# Patient Record
Sex: Male | Born: 1965 | Race: Black or African American | Smoking: Never smoker
Health system: Southern US, Community
[De-identification: ages and names within clinical notes are randomized; demographics above are authoritative.]

## PROBLEM LIST (undated history)

## (undated) DIAGNOSIS — I10 Essential (primary) hypertension: Secondary | ICD-10-CM

## (undated) HISTORY — PX: NO PAST SURGERIES: SHX2092

---

## 2007-09-08 ENCOUNTER — Ambulatory Visit: Payer: Self-pay | Admitting: Internal Medicine

## 2010-05-23 ENCOUNTER — Ambulatory Visit: Payer: Self-pay | Admitting: Internal Medicine

## 2011-03-27 ENCOUNTER — Ambulatory Visit: Payer: Self-pay

## 2011-08-28 ENCOUNTER — Ambulatory Visit: Payer: Self-pay

## 2012-02-21 IMAGING — CT CT CHEST W/ CM
1 of 2 series · 15 of 32 positions shown, 19 images · IV contrast (APPLIED)
Comparison: None

REASON FOR EXAM: ADD ON CR [id number] or [id number]   chest pain  abn EKG
eval PE
COMMENTS:

PROCEDURE:     KCT - KCT CHEST WITH CONTRAST  - May 23, 2010 [DATE]
RESULT:     Indications: Chest pain
TECHNIQUE: A thin-section spiral CT from the lung apices to the upper
abdomen was acquired on a multi slice scanner following 100ml [id number]
intravenous contrast. These images were then transferred to the Siemens work
station and were subsequently reviewed utilizing 3-D reconstructions and MIP
images.

[Series 4: pe_avg 3.0 i41f · axial · 0.64mm/px · z∈[-134,+106]mm · 15 of 94 slices shown, 19 images]
[im 7/94  mediastinal]
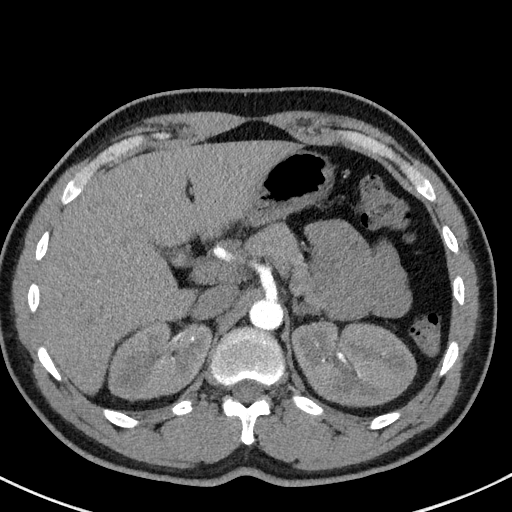
[im 7/94  lung]
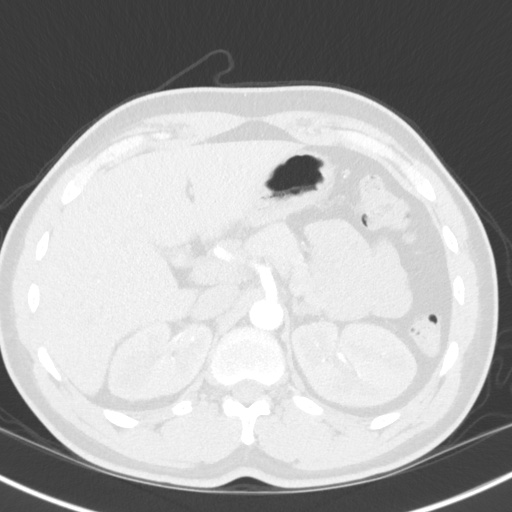
[im 13/94  lung]
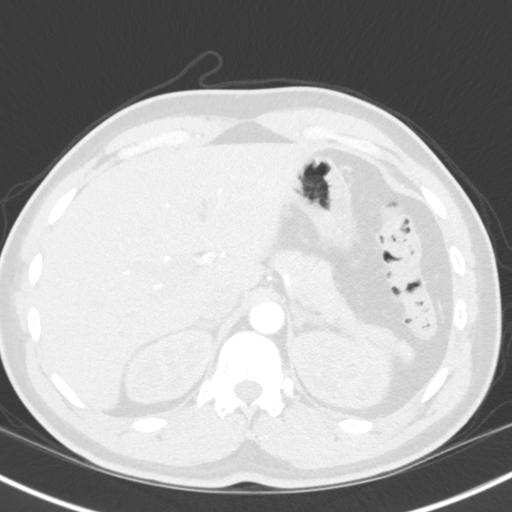
[im 19/94  lung]
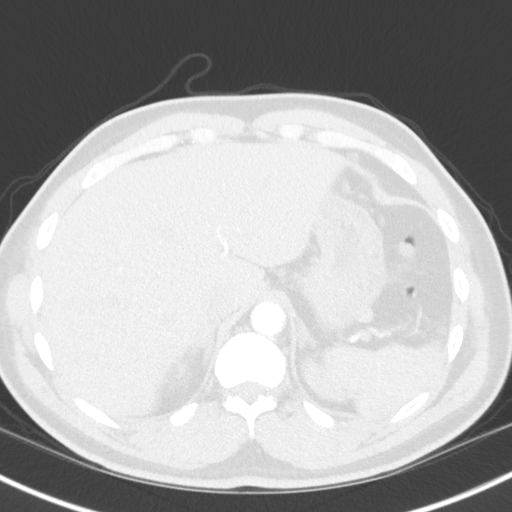
[im 25/94  lung]
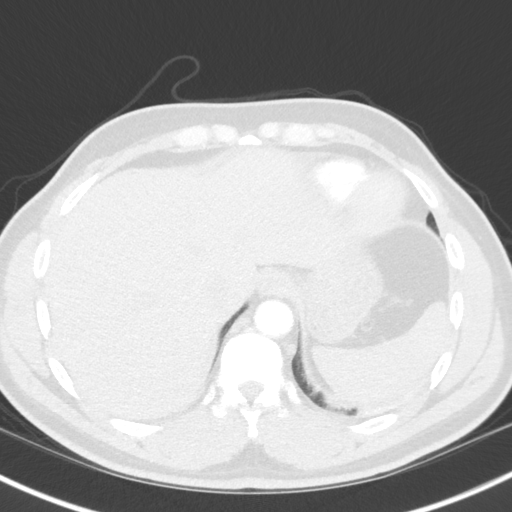
[im 32/94  mediastinal]
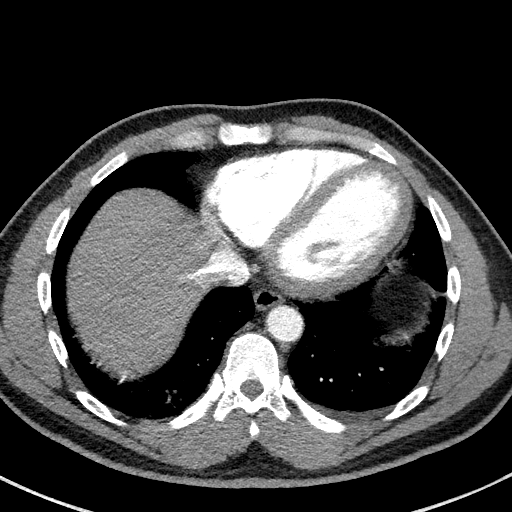
[im 32/94  lung]
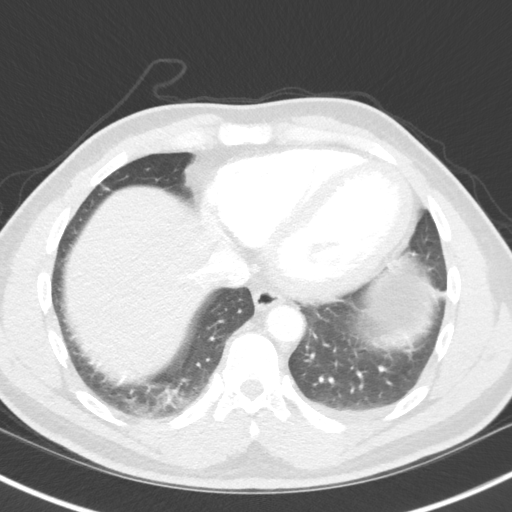
[im 38/94  lung]
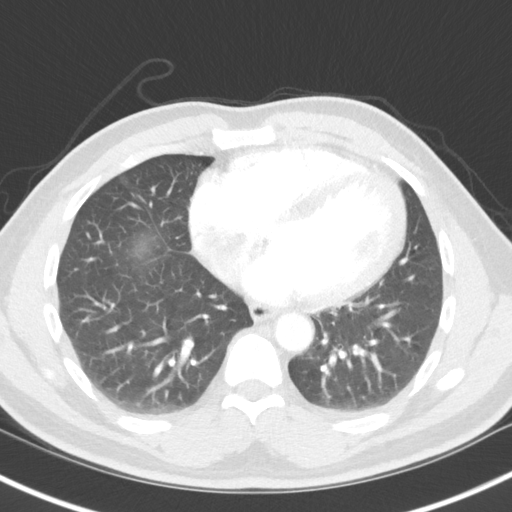
[im 44/94  lung]
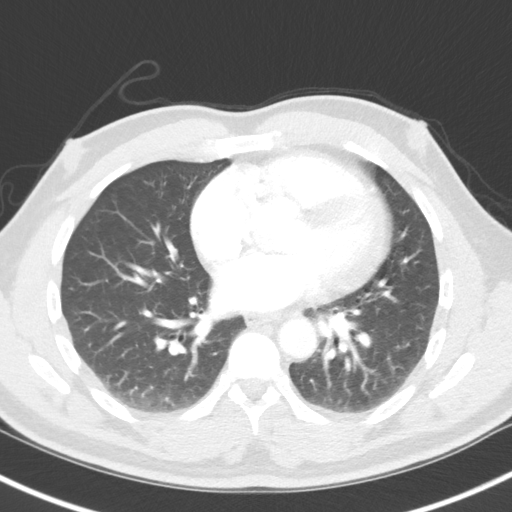
[im 47/94  lung]
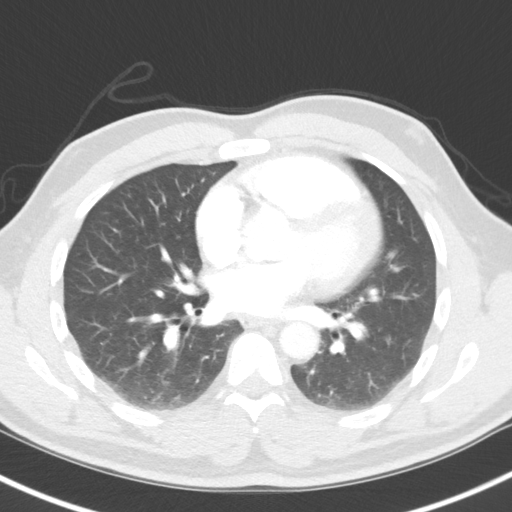
[im 50/94  mediastinal]
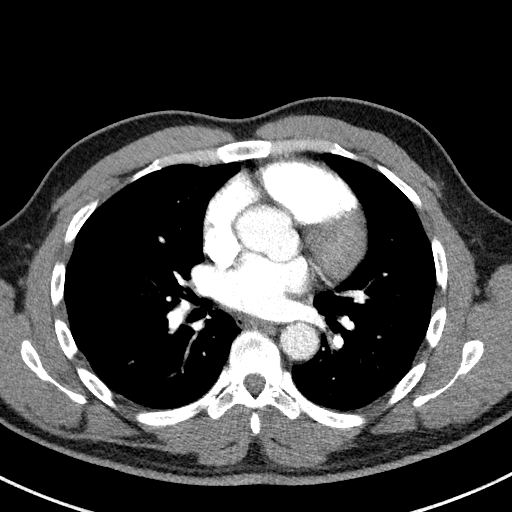
[im 50/94  lung]
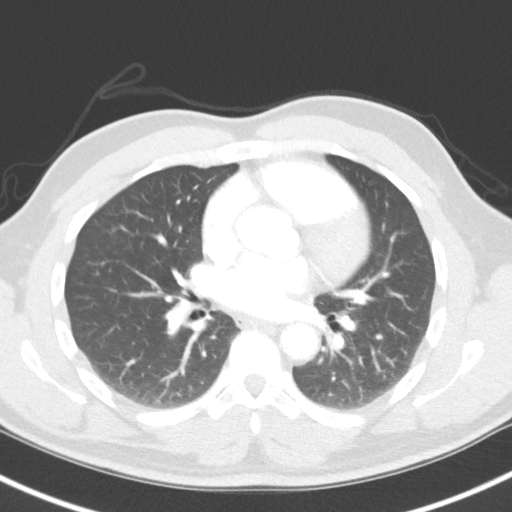
[im 56/94  lung]
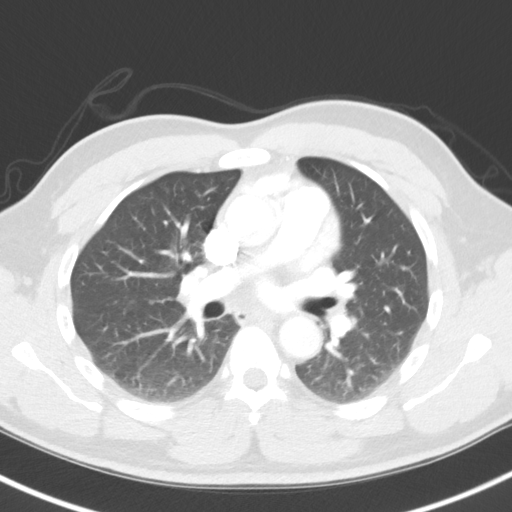
[im 63/94  lung]
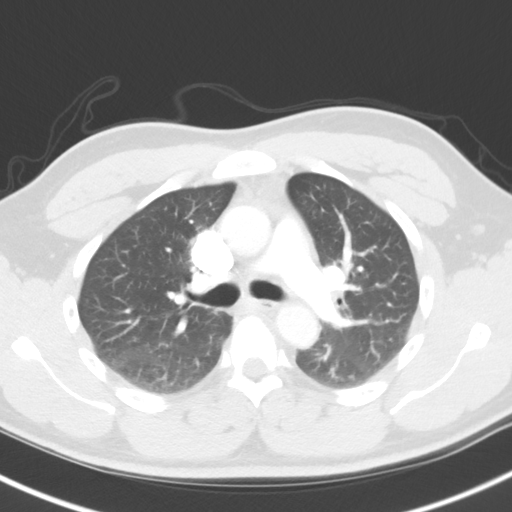
[im 69/94  lung]
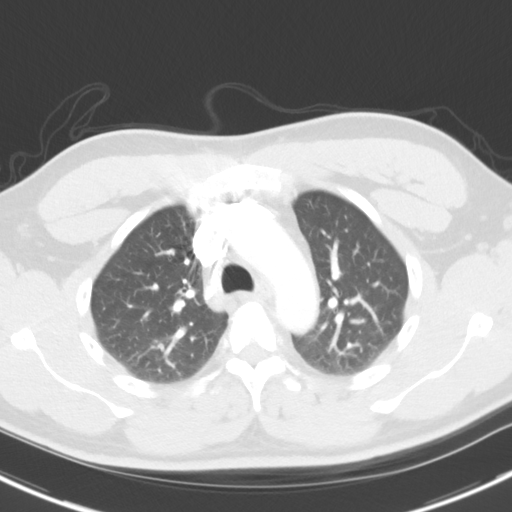
[im 75/94  mediastinal]
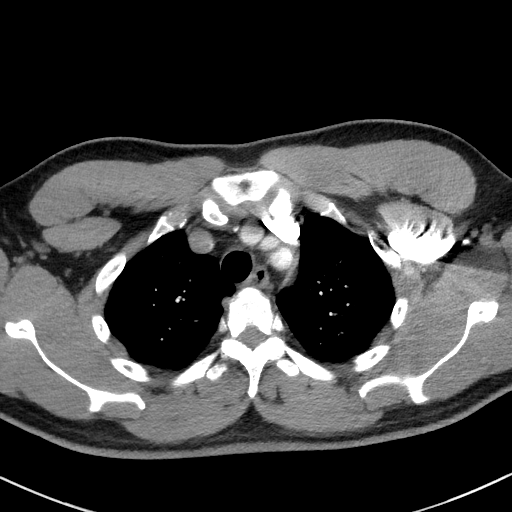
[im 75/94  lung]
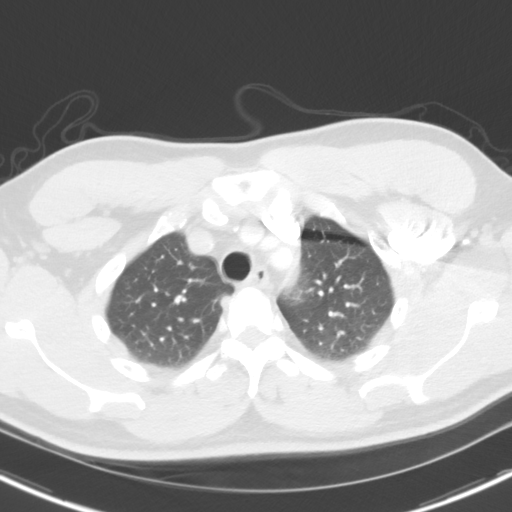
[im 81/94  lung]
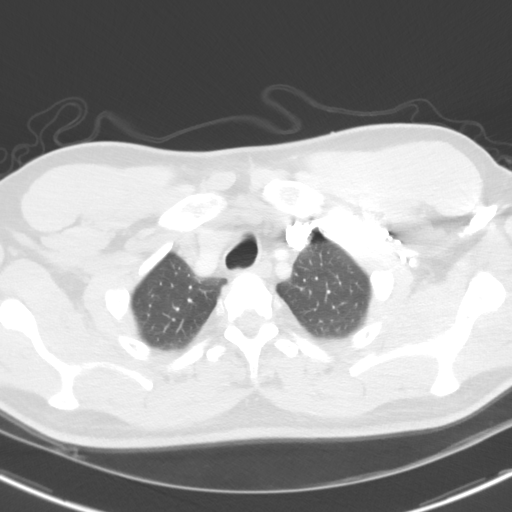
[im 87/94  lung]
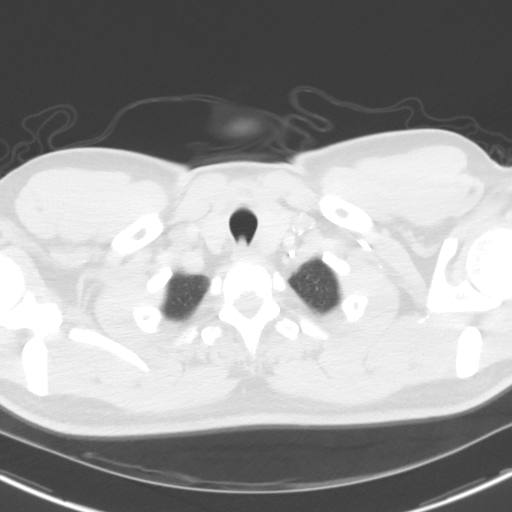

[15 of 32 positions shown; findings below may reference images not displayed]

FINDINGS: There is adequate opacification of the pulmonary arteries. There is no
pulmonary embolus. The main pulmonary artery, right main pulmonary artery,
and left main pulmonary arteries are normal in size. The heart size is
normal. There is no pericardial effusion.

The lungs are clear. There is no focal consolidation, pleural effusion, or
pneumothorax.

There is no axillary, hilar, or mediastinal adenopathy.

The osseous structures are unremarkable.

The visualized portions of the upper abdomen are unremarkable.
IMPRESSION: 1. No CT evidence of pulmonary embolus.

## 2013-05-28 IMAGING — CR CERVICAL SPINE - COMPLETE 4+ VIEW
1 series · 7 of 7 positions shown · non-contrast
Comparison: none

REASON FOR EXAM: pain
COMMENTS:

[Series 1: lat · 0.17mm/px · 7 of 7 slices shown]
[im 1/7]
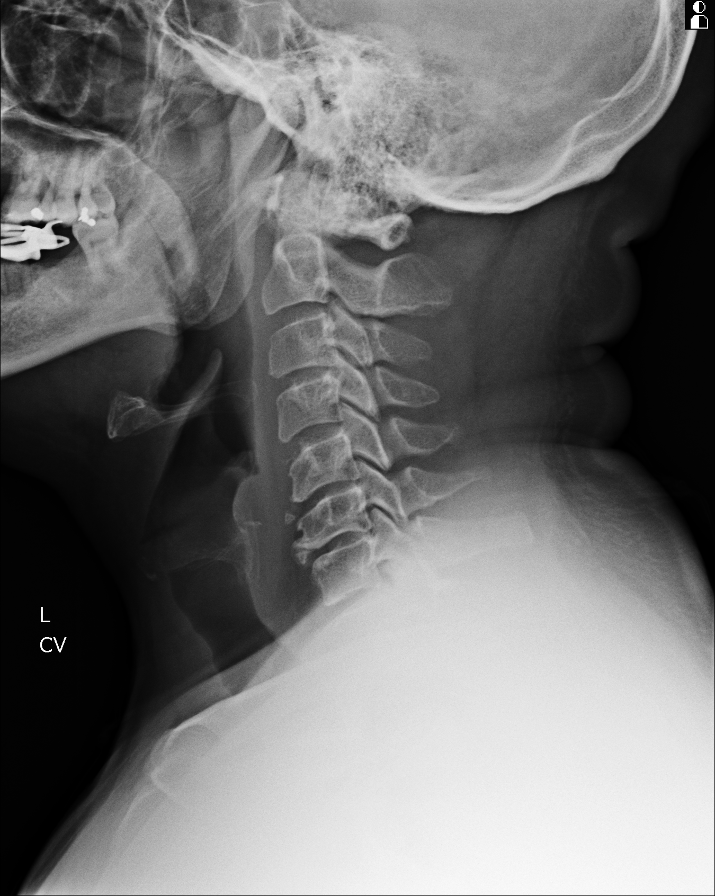
[im 2/7]
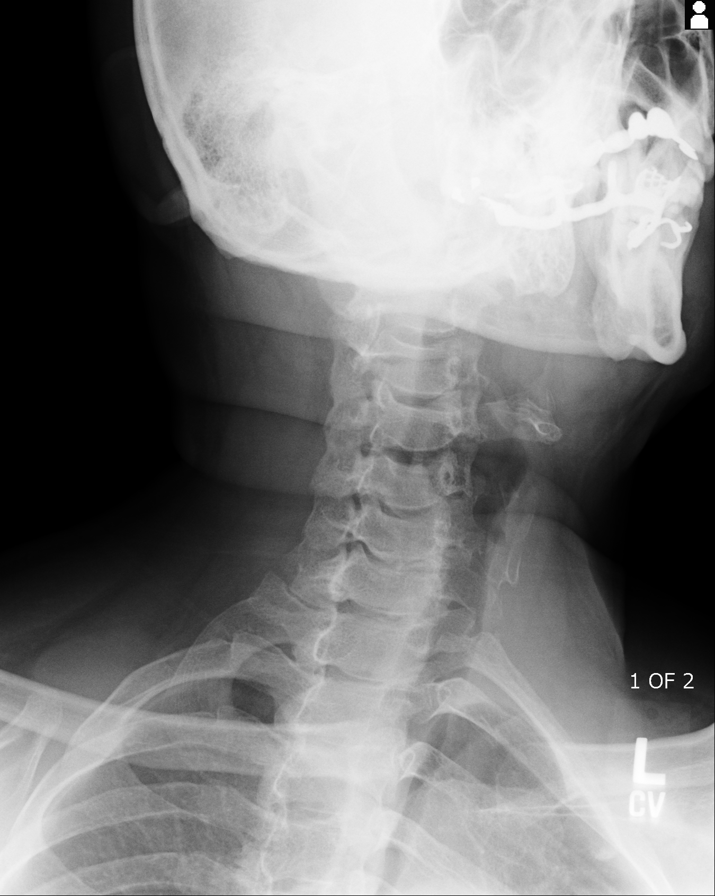
[im 3/7]
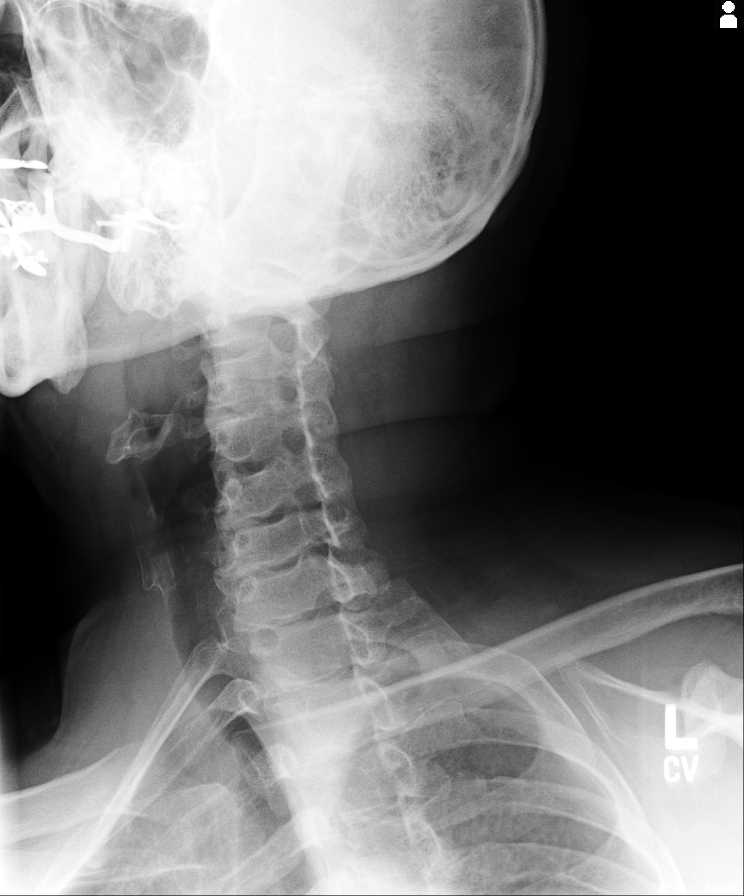
[im 4/7]
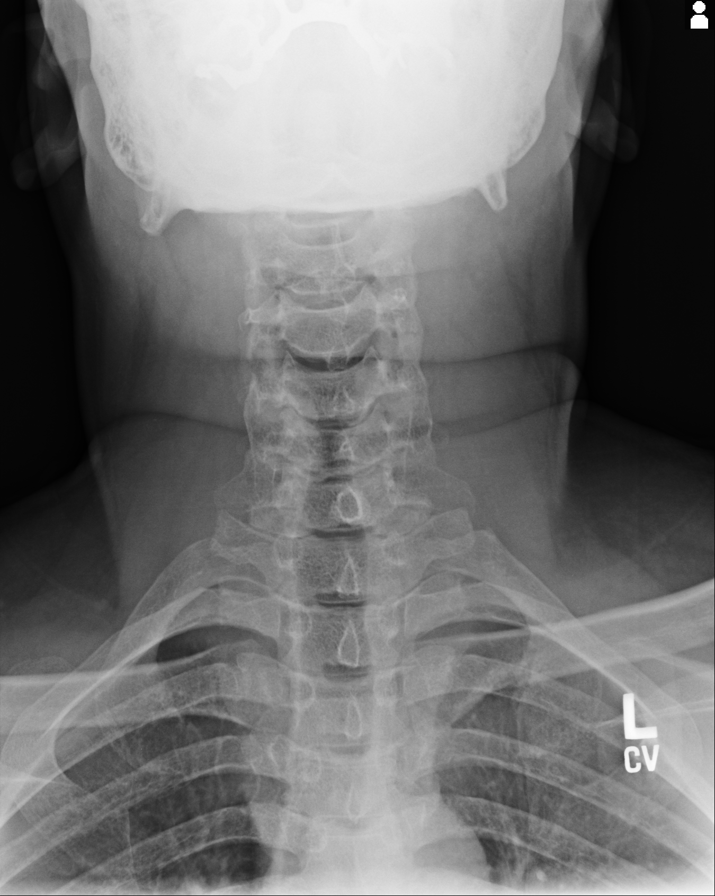
[im 5/7]
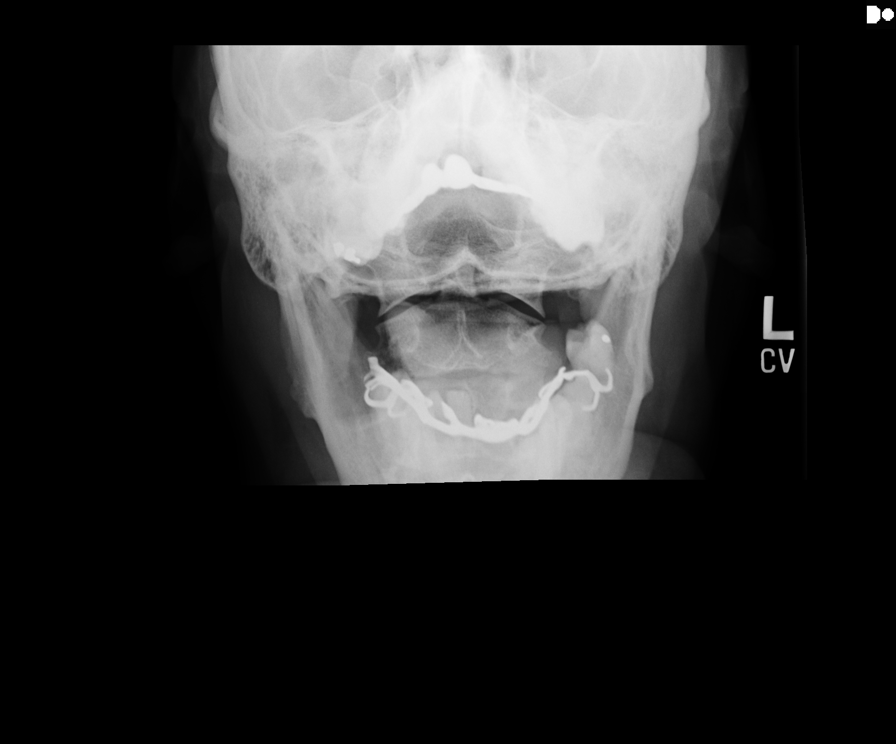
[im 6/7]
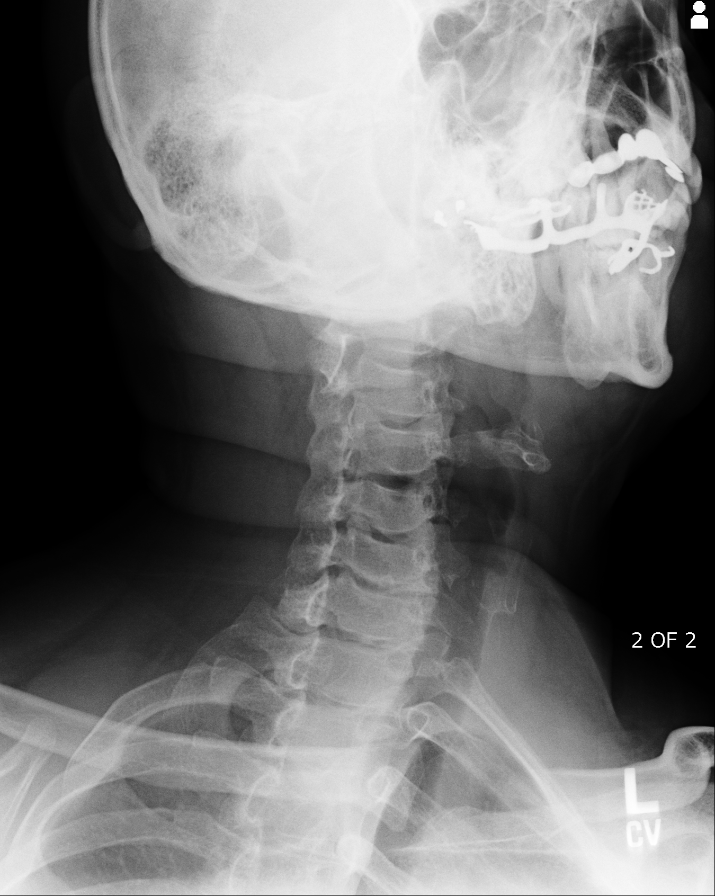
[im 7/7]
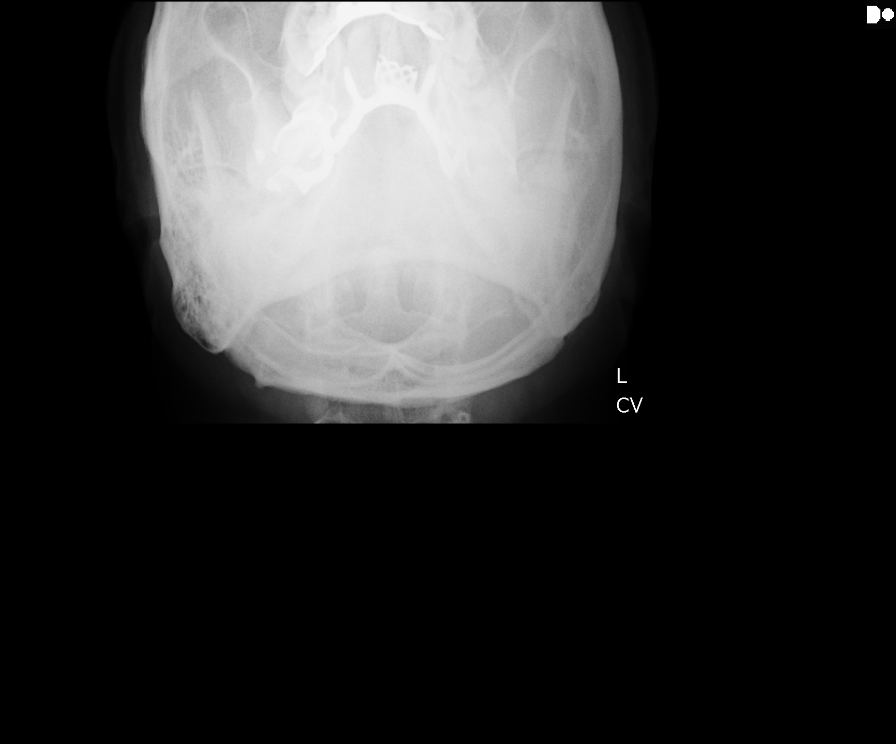

[7 of 7 positions shown; findings below may reference images not displayed]

PROCEDURE:     KDR - KDXR C-SPINE COMPLETE  - August 28, 2011  [DATE]

RESULT:     The vertebral body heights are well maintained. Vertebral body
alignment is normal. Oblique views show no significant abnormalities of the
articular facets. There is observed slight narrowing of the cervical and
vertebral disc spaces at C5-6 and C6-7. The odontoid process is intact.
Cervical rib formation is seen.
IMPRESSION: 1.  No fracture is identified.
2.  Vertebral body alignment is normal.
3.  There is slight narrowing of the C5-6 and C6-7 cervical intervertebral
disc spaces. The findings are consistent with cervical disc disease. This
can be further evaluated with MR, if clinically indicated.

       Dictation site; #2.

## 2013-05-28 IMAGING — CR DG SHOULDER 3+V*L*
1 series · 3 of 3 positions shown · non-contrast
Comparison: none

REASON FOR EXAM: pain
COMMENTS:

PROCEDURE:     KDR - KDXR SHOULDER LEFT COMPLETE  - August 28, 2011  [DATE]
RESULT:     No fracture, dislocation or other acute bony abnormality is
identified.

[Series 1: internal rotate · 0.17mm/px · 3 of 3 slices shown]
[im 1/3]
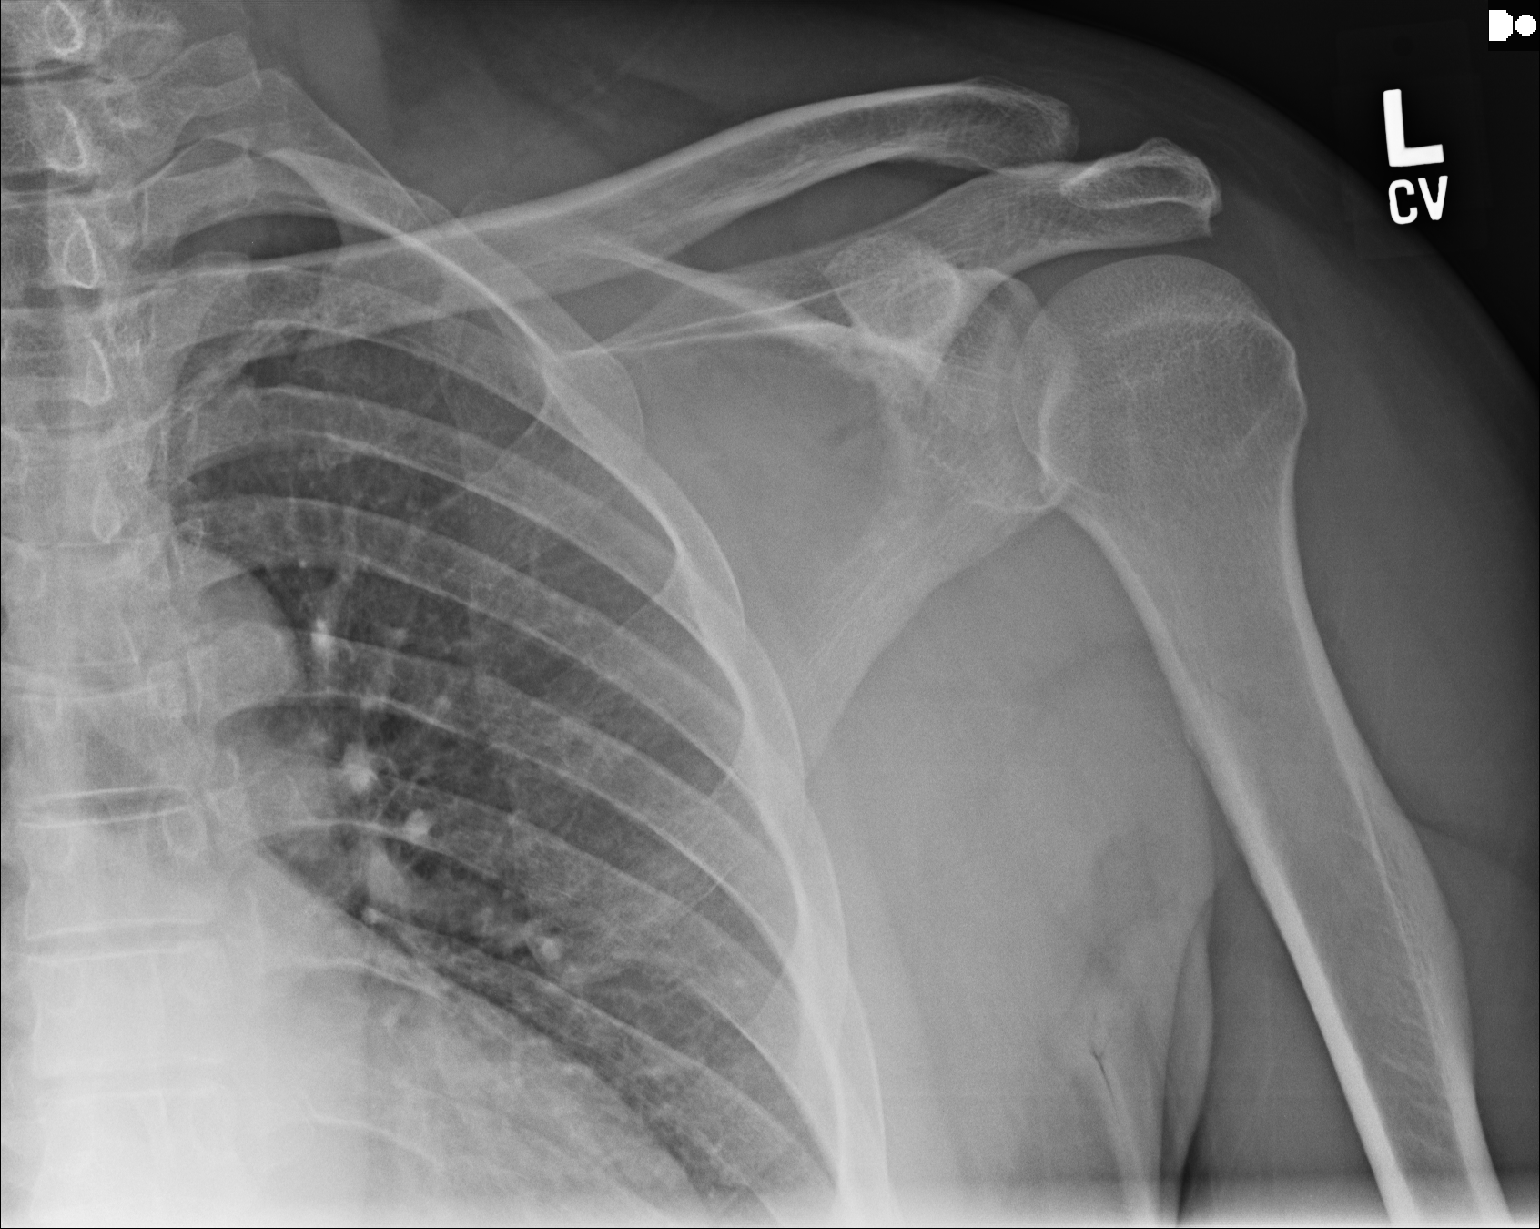
[im 2/3]
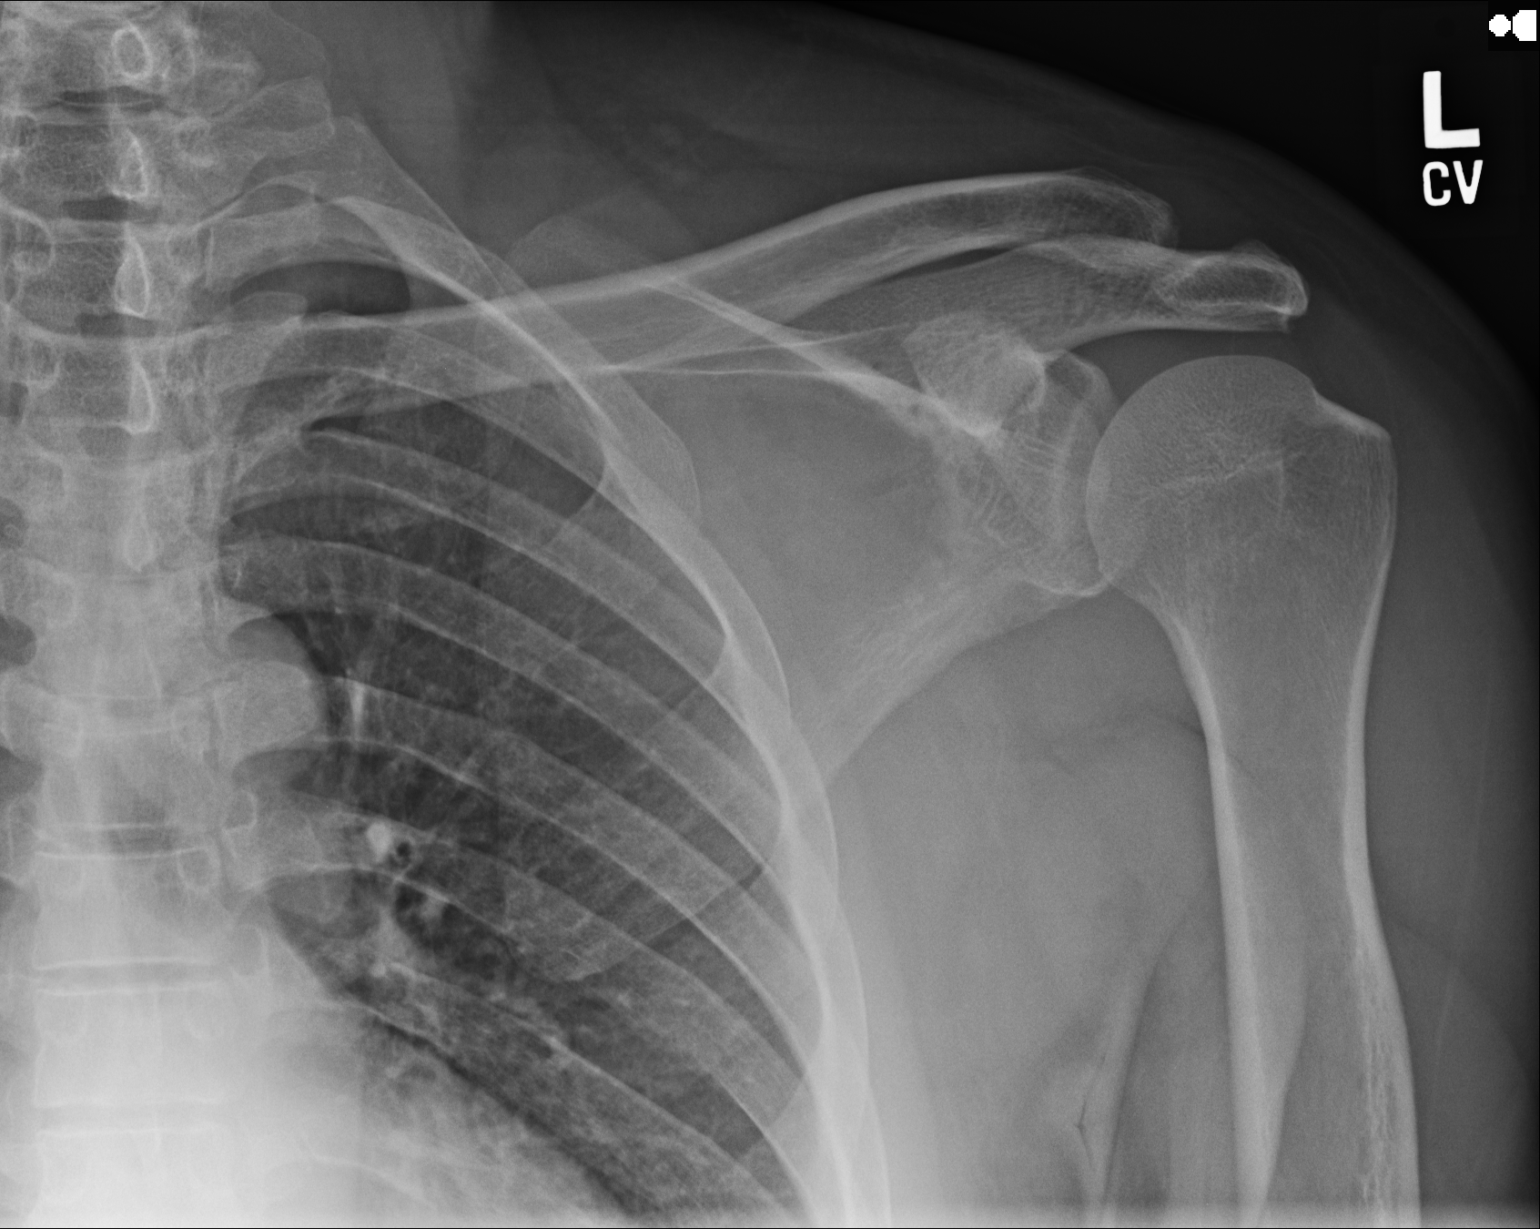
[im 3/3]
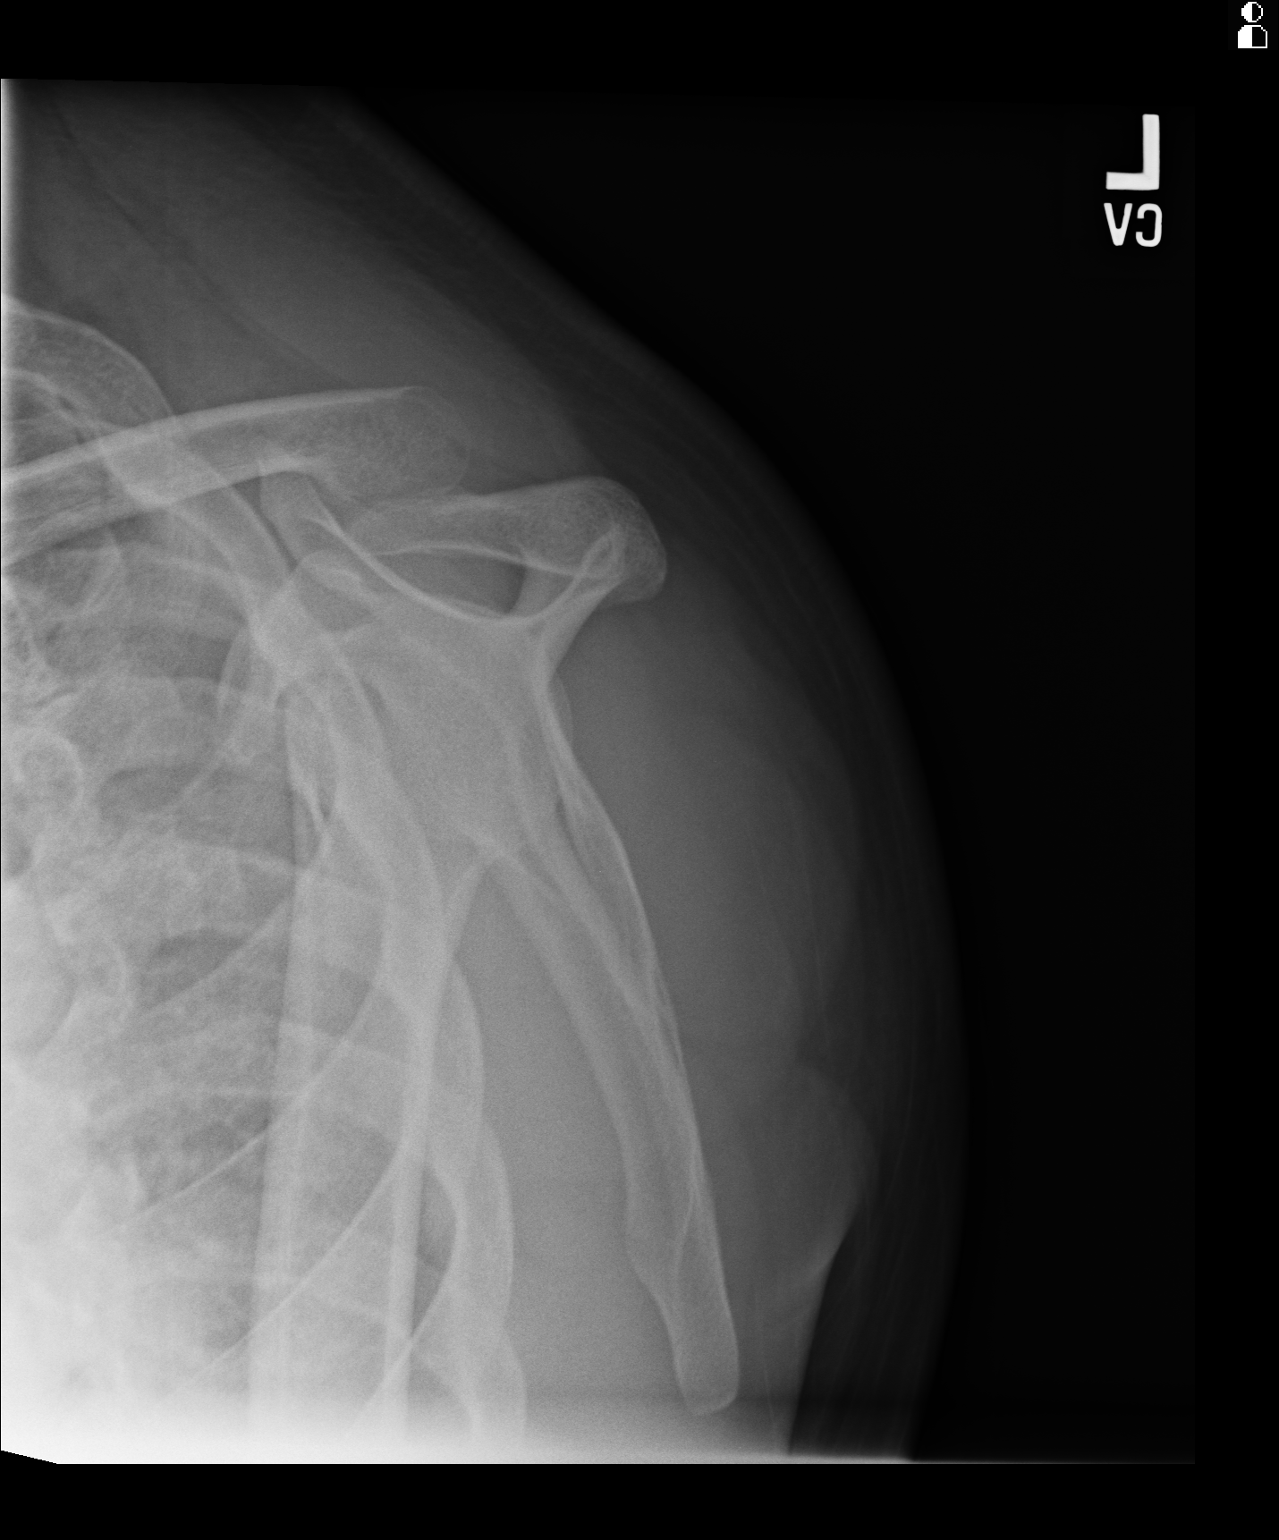

[3 of 3 positions shown; findings below may reference images not displayed]

IMPRESSION: No significant osseous abnormalities are noted.

[REDACTED]

## 2015-03-29 ENCOUNTER — Other Ambulatory Visit: Payer: Self-pay | Admitting: Gastroenterology

## 2015-06-04 ENCOUNTER — Ambulatory Visit (HOSPITAL_COMMUNITY): Admission: RE | Admit: 2015-06-04 | Payer: Self-pay | Source: Ambulatory Visit | Admitting: Gastroenterology

## 2015-06-04 ENCOUNTER — Encounter (HOSPITAL_COMMUNITY): Admission: RE | Payer: Self-pay | Source: Ambulatory Visit

## 2015-06-04 SURGERY — COLONOSCOPY WITH PROPOFOL
Anesthesia: Monitor Anesthesia Care

## 2015-06-19 ENCOUNTER — Other Ambulatory Visit: Payer: Self-pay | Admitting: Gastroenterology

## 2015-06-21 ENCOUNTER — Encounter (HOSPITAL_COMMUNITY): Payer: Self-pay | Admitting: *Deleted

## 2015-06-25 ENCOUNTER — Ambulatory Visit (HOSPITAL_COMMUNITY)
Admission: RE | Admit: 2015-06-25 | Discharge: 2015-06-25 | Disposition: A | Discharge status: Home / Self Care | Payer: BLUE CROSS/BLUE SHIELD | Source: Ambulatory Visit | Attending: Gastroenterology | Admitting: Gastroenterology

## 2015-06-25 ENCOUNTER — Encounter (HOSPITAL_COMMUNITY): Payer: Self-pay

## 2015-06-25 ENCOUNTER — Ambulatory Visit (HOSPITAL_COMMUNITY): Payer: BLUE CROSS/BLUE SHIELD | Admitting: Anesthesiology

## 2015-06-25 ENCOUNTER — Encounter (HOSPITAL_COMMUNITY)
Admission: RE | Disposition: A | Discharge status: Home / Self Care | Payer: Self-pay | Source: Ambulatory Visit | Attending: Gastroenterology

## 2015-06-25 DIAGNOSIS — Z1211 Encounter for screening for malignant neoplasm of colon: Secondary | ICD-10-CM | POA: Insufficient documentation

## 2015-06-25 DIAGNOSIS — D123 Benign neoplasm of transverse colon: Secondary | ICD-10-CM | POA: Diagnosis not present

## 2015-06-25 DIAGNOSIS — I10 Essential (primary) hypertension: Secondary | ICD-10-CM | POA: Insufficient documentation

## 2015-06-25 HISTORY — PX: COLONOSCOPY WITH PROPOFOL: SHX5780

## 2015-06-25 HISTORY — DX: Essential (primary) hypertension: I10

## 2015-06-25 SURGERY — COLONOSCOPY WITH PROPOFOL
Anesthesia: Monitor Anesthesia Care

## 2015-06-25 MED ORDER — LIDOCAINE HCL (CARDIAC) 20 MG/ML IV SOLN
INTRAVENOUS | Status: AC
  Filled 2015-06-25: qty 5

## 2015-06-25 MED ORDER — LIDOCAINE HCL (CARDIAC) 20 MG/ML IV SOLN
INTRAVENOUS | Status: DC | PRN
  Administered 2015-06-25: 25 mg via INTRATRACHEAL

## 2015-06-25 MED ORDER — PROPOFOL 500 MG/50ML IV EMUL
INTRAVENOUS | Status: DC | PRN
  Administered 2015-06-25: 120 ug/kg/min via INTRAVENOUS

## 2015-06-25 MED ORDER — LACTATED RINGERS IV SOLN: INTRAVENOUS | Status: DC

## 2015-06-25 MED ORDER — PROPOFOL 10 MG/ML IV BOLUS
INTRAVENOUS | Status: DC | PRN
  Administered 2015-06-25: 20 mg via INTRAVENOUS
  Administered 2015-06-25: 30 mg via INTRAVENOUS
  Administered 2015-06-25 (×3): 20 mg via INTRAVENOUS

## 2015-06-25 MED ORDER — PROPOFOL 10 MG/ML IV BOLUS
INTRAVENOUS | Status: AC
  Filled 2015-06-25: qty 60

## 2015-06-25 MED ORDER — LACTATED RINGERS IV SOLN
INTRAVENOUS | Status: DC | PRN
  Administered 2015-06-25: 08:00:00 via INTRAVENOUS

## 2015-06-25 SURGICAL SUPPLY — 21 items

## 2015-06-25 NOTE — Progress Notes (Signed)
pts blood pressure 160's/100. Dr. Jillyn Hidden made aware. DR. Jillyn Hidden advised for pt to take his blood pressure medicine when gets home. Pt voiced understanding and will take.

## 2015-06-25 NOTE — Transfer of Care (Signed)
Immediate Anesthesia Transfer of Care Note  Patient: Edward Cain  Procedure(s) Performed: Procedure(s): COLONOSCOPY WITH PROPOFOL (N/A)  Patient Location: PACU and Endoscopy Unit  Anesthesia Type:MAC  Level of Consciousness: sedated and patient cooperative  Airway & Oxygen Therapy: Patient Spontanous Breathing and Patient connected to face mask oxygen  Post-op Assessment: Report given to RN and Post -op Vital signs reviewed and stable  Post vital signs: Reviewed and stable  Last Vitals: There were no vitals filed for this visit.  Complications: No apparent anesthesia complications

## 2015-06-25 NOTE — H&P (Signed)
  Procedure: Baseline screening colonoscopy. No family history of colon cancer  History: Patient is a 50 year old male born 02-12-1966. He is scheduled to undergo his first screening colonoscopy with polypectomy to prevent colon cancer.  Medication allergies: None  Past medical history: Hypertension  Family history: Negative for colon cancer  Exam: The patient is alert and lying comfortably on the endoscopy stretcher. Abdomen is soft and nontender to palpation. Lungs are clear to auscultation. Cardiac exam reveals a regular rhythm. There is a grade 2/6 early systolic heard only in the mitral area.  Plan: Proceed with baseline screening colonoscopy

## 2015-06-25 NOTE — Anesthesia Postprocedure Evaluation (Signed)
Anesthesia Post Note  Patient: Edward Cain  Procedure(s) Performed: Procedure(s) (LRB): COLONOSCOPY WITH PROPOFOL (N/A)  Patient location during evaluation: PACU Anesthesia Type: MAC Level of consciousness: awake and alert Pain management: pain level controlled Vital Signs Assessment: post-procedure vital signs reviewed and stable Respiratory status: spontaneous breathing, nonlabored ventilation, respiratory function stable and patient connected to nasal cannula oxygen Cardiovascular status: stable and blood pressure returned to baseline Anesthetic complications: no    Last Vitals:  Filed Vitals:   06/25/15 0913  BP: 142/85  Pulse: 72  Temp: 36.7 C  Resp: 14    Last Pain: There were no vitals filed for this visit.               Zenaida Deed

## 2015-06-25 NOTE — Anesthesia Preprocedure Evaluation (Signed)
Anesthesia Evaluation  Patient identified by MRN, date of birth, ID band Patient awake    Reviewed: Allergy & Precautions, H&P , NPO status , Patient's Chart, lab work & pertinent test results  History of Anesthesia Complications Negative for: history of anesthetic complications  Airway Mallampati: II  TM Distance: >3 FB Neck ROM: full    Dental no notable dental hx.    Pulmonary neg pulmonary ROS,    Pulmonary exam normal breath sounds clear to auscultation       Cardiovascular hypertension, negative cardio ROS Normal cardiovascular exam Rhythm:regular Rate:Normal     Neuro/Psych negative neurological ROS     GI/Hepatic negative GI ROS, Neg liver ROS,   Endo/Other  negative endocrine ROS  Renal/GU negative Renal ROS     Musculoskeletal   Abdominal   Peds  Hematology negative hematology ROS (+)   Anesthesia Other Findings   Reproductive/Obstetrics negative OB ROS                             Anesthesia Physical Anesthesia Plan  ASA: II  Anesthesia Plan: MAC   Post-op Pain Management:    Induction: Intravenous  Airway Management Planned: Simple Face Mask  Additional Equipment:   Intra-op Plan:   Post-operative Plan:   Informed Consent: I have reviewed the patients History and Physical, chart, labs and discussed the procedure including the risks, benefits and alternatives for the proposed anesthesia with the patient or authorized representative who has indicated his/her understanding and acceptance.   Dental Advisory Given  Plan Discussed with: Anesthesiologist, CRNA and Surgeon  Anesthesia Plan Comments:         Anesthesia Quick Evaluation

## 2015-06-25 NOTE — Op Note (Signed)
[facility] Patient Name: Edward Cain Procedure Date: 06/25/2015 MRN: [id number] Attending MD: Garlan Fair , MD Date of Birth: [birth year] CSN:  Age: 50 Admit Type: Outpatient Account #: [id number] Procedure:                Colonoscopy Indications:              Screening for colorectal malignant neoplasm Providers:                Garlan Fair, MD, Hilma Favors, RN, Cletis Athens, Technician, Ralene Bathe Referring MD:              Medicines:                Monitored Anesthesia Care Complications:            No immediate complications. Estimated Blood Loss:     Estimated blood loss: none. Procedure:                Pre-Anesthesia Assessment:                           - Prior to the procedure, a History and Physical                            was performed, and patient medications and                            allergies were reviewed. The risks and benefits of                            the procedure and the sedation options and risks                            were discussed with the patient. All questions were                            answered and informed consent was obtained. Patient                            identification and proposed procedure were                            verified. Mental Status Examination: normal.                            Prophylactic Antibiotics: The patient does not                            require prophylactic antibiotics. Prior                            Anticoagulants: The patient has taken no previous                            anticoagulant  or antiplatelet agents. ASA Grade                            Assessment: II - A patient with mild systemic                            disease. After reviewing the risks and benefits,                            the patient was deemed in satisfactory condition to                            undergo the procedure. The anesthesia plan was to             use monitored anesthesia care (MAC). Immediately                            prior to administration of medications, the patient                            was re-assessed for adequacy to receive sedatives.                            The heart rate, respiratory rate, oxygen                            saturations, blood pressure, adequacy of pulmonary                            ventilation, and response to care were monitored                            throughout the procedure. The physical status of                            the patient was re-assessed after the procedure.                           After obtaining informed consent, the colonoscope                            was passed under direct vision. Throughout the                            procedure, the patient's blood pressure, pulse, and                            oxygen saturations were monitored continuously. The                            [id number] scope was introduced through                            the anus and advanced to the the cecum, identified  by appendiceal orifice and ileocecal valve. The                            colonoscopy was performed without difficulty. The                            patient tolerated the procedure well. The quality                            of the bowel preparation was adequate. The                            ileocecal valve and the appendiceal orifice were                            photographed. Scope In: [id number] Scope Out: 9:08:50 AM Scope Withdrawal Time: 0 hours 16 minutes 6 seconds  Total Procedure Duration: 0 hours 22 minutes 32 seconds  Findings:      A 10 mm polyp was found in the proximal transverse colon. The polyp was       pedunculated. The polyp was removed with a hot snare. Resection and       retrieval were complete. To prevent bleeding post-intervention, one       hemostatic clip was successfully placed. There was no  bleeding at the       end of the procedure.      The exam was otherwise without abnormality. Impression:               - One 10 mm polyp in the proximal transverse colon,                            removed with a hot snare. Resected and retrieved.                            Clip was placed.                           - The examination was otherwise normal. Moderate Sedation:      N/A- Per Anesthesia Care Recommendation:           - Patient has a contact number available for                            emergencies. The signs and symptoms of potential                            delayed complications were discussed with the                            patient. Return to normal activities tomorrow.                            Written discharge instructions were provided to the  patient.                           - Resume previous diet.                           - Continue present medications.                           - Repeat colonoscopy is recommended. The                            colonoscopy date will be determined after pathology                            results from today's exam become available for                            review.                           - Return to primary care physician PRN. Procedure Code(s):        --- Professional ---                           [phone], Colonoscopy, flexible; with removal of                            tumor(s), polyp(s), or other lesion(s) by snare                            technique Diagnosis Code(s):        --- Professional ---                           Z12.11, Encounter for screening for malignant                            neoplasm of colon                           D12.3, Benign neoplasm of transverse colon (hepatic                            flexure or splenic flexure) CPT copyright 2016 [employer]. All rights reserved. The codes documented in this report are preliminary and upon coder review  may  be revised to meet current compliance requirements. Earle Gell, [facility] Garlan Fair, MD 06/25/2015 9:22:09 AM This report has been signed electronically. Number of Addenda: 0

## 2015-06-25 NOTE — Discharge Instructions (Signed)

## 2015-06-27 ENCOUNTER — Encounter (HOSPITAL_COMMUNITY): Payer: Self-pay | Admitting: Gastroenterology

## 2016-05-12 DIAGNOSIS — I10 Essential (primary) hypertension: Secondary | ICD-10-CM | POA: Diagnosis not present

## 2016-05-12 DIAGNOSIS — M25511 Pain in right shoulder: Secondary | ICD-10-CM | POA: Diagnosis not present

## 2016-09-22 DIAGNOSIS — Z136 Encounter for screening for cardiovascular disorders: Secondary | ICD-10-CM | POA: Diagnosis not present

## 2016-09-22 DIAGNOSIS — R7301 Impaired fasting glucose: Secondary | ICD-10-CM | POA: Diagnosis not present

## 2016-09-22 DIAGNOSIS — Z0000 Encounter for general adult medical examination without abnormal findings: Secondary | ICD-10-CM | POA: Diagnosis not present

## 2018-01-11 DIAGNOSIS — I10 Essential (primary) hypertension: Secondary | ICD-10-CM | POA: Diagnosis not present

## 2018-01-11 DIAGNOSIS — E7800 Pure hypercholesterolemia, unspecified: Secondary | ICD-10-CM | POA: Diagnosis not present

## 2018-01-20 DIAGNOSIS — R7309 Other abnormal glucose: Secondary | ICD-10-CM | POA: Diagnosis not present

## 2018-06-22 DIAGNOSIS — S86112A Strain of other muscle(s) and tendon(s) of posterior muscle group at lower leg level, left leg, initial encounter: Secondary | ICD-10-CM | POA: Diagnosis not present

## 2018-08-18 DIAGNOSIS — M25511 Pain in right shoulder: Secondary | ICD-10-CM | POA: Diagnosis not present

## 2018-08-18 DIAGNOSIS — I10 Essential (primary) hypertension: Secondary | ICD-10-CM | POA: Diagnosis not present

## 2018-08-18 DIAGNOSIS — E7800 Pure hypercholesterolemia, unspecified: Secondary | ICD-10-CM | POA: Diagnosis not present

## 2018-08-24 DIAGNOSIS — I10 Essential (primary) hypertension: Secondary | ICD-10-CM | POA: Diagnosis not present

## 2018-08-24 DIAGNOSIS — Z125 Encounter for screening for malignant neoplasm of prostate: Secondary | ICD-10-CM | POA: Diagnosis not present

## 2024-05-06 ENCOUNTER — Other Ambulatory Visit: Payer: Self-pay | Admitting: Urology

## 2024-05-06 DIAGNOSIS — R972 Elevated prostate specific antigen [PSA]: Secondary | ICD-10-CM

## 2024-06-25 ENCOUNTER — Other Ambulatory Visit
